# Patient Record
Sex: Female | Born: 1975 | Race: White | Hispanic: No | Marital: Married | Smoking: Never smoker
Health system: Southern US, Community
[De-identification: ages and names within clinical notes are randomized; demographics above are authoritative.]

## PROBLEM LIST (undated history)

## (undated) DIAGNOSIS — G43909 Migraine, unspecified, not intractable, without status migrainosus: Secondary | ICD-10-CM

## (undated) HISTORY — PX: TONSILLECTOMY: SUR1361

## (undated) HISTORY — DX: Migraine, unspecified, not intractable, without status migrainosus: G43.909

## (undated) HISTORY — PX: PLACEMENT OF BREAST IMPLANTS: SHX6334

---

## 2019-07-11 ENCOUNTER — Other Ambulatory Visit: Payer: Self-pay

## 2019-07-12 ENCOUNTER — Encounter: Payer: Self-pay | Admitting: Family Medicine

## 2019-07-12 ENCOUNTER — Other Ambulatory Visit: Payer: Self-pay | Admitting: *Deleted

## 2019-07-12 ENCOUNTER — Telehealth: Payer: Self-pay | Admitting: Family Medicine

## 2019-07-12 ENCOUNTER — Ambulatory Visit (INDEPENDENT_AMBULATORY_CARE_PROVIDER_SITE_OTHER): Payer: Managed Care, Other (non HMO) | Admitting: Family Medicine

## 2019-07-12 VITALS — BP 160/110 | HR 77 | Temp 98.1°F | Ht 65.0 in | Wt 188.6 lb

## 2019-07-12 DIAGNOSIS — I10 Essential (primary) hypertension: Secondary | ICD-10-CM

## 2019-07-12 DIAGNOSIS — Z7689 Persons encountering health services in other specified circumstances: Secondary | ICD-10-CM

## 2019-07-12 DIAGNOSIS — Z2821 Immunization not carried out because of patient refusal: Secondary | ICD-10-CM

## 2019-07-12 DIAGNOSIS — Z1239 Encounter for other screening for malignant neoplasm of breast: Secondary | ICD-10-CM

## 2019-07-12 DIAGNOSIS — Z124 Encounter for screening for malignant neoplasm of cervix: Secondary | ICD-10-CM | POA: Diagnosis not present

## 2019-07-12 DIAGNOSIS — R1011 Right upper quadrant pain: Secondary | ICD-10-CM

## 2019-07-12 MED ORDER — LOSARTAN POTASSIUM 50 MG PO TABS: 50.0000 mg | ORAL_TABLET | Freq: Every day | ORAL | 3 refills | Status: DC

## 2019-07-12 NOTE — Telephone Encounter (Signed)
Medication reordered to new pharmacy for patient- attempted to call patient to inform that request was finished- mailbox is full message.

## 2019-07-12 NOTE — Progress Notes (Signed)
Yesenia Ortiz is a 43 y.o. female  Chief Complaint  Patient presents with  . Establish Care    est care/ went to UC for migraine 7 days ago/ vomiting 3 days/ giving zofran/ RUQ pain 7 days    HPI: Yesenia Ortiz is a 43 y.o. female here to establish care with our office. She moved to area from [place] 1 year ago. She has 2 boys (40yo and 41yo), married.   She has a h/o migraine headaches, usually associated with her menstrual cycle. She typically takes excedrin. She tried Rx meds but had side effects. She experienced the worse headache to date about 1 week ago. She went to UC due to intensity of headache, vomiting. She was given zofran PRN which was effective. She is not taking it anymore. No headache today.  Pts BP is elevated in office today. She notes having elevated BP in the past and she states this comes and goes, "but mostly stays now".  She has been on med for HTN in the past but did not feel "it was the right medication due to side effects" , mostly fatigue. She was on propranolol 20mg  BID. She notes increased stress due to 2 boys with virtual learning. Pt has also gained weight in the past 1+ year. She walks 2 miles at least 4 days per week.  Mom with HTN.   Pt had 1 episode of RUQ pain with nausea and vomiting along with migraine headache 1 week ago. She does not eat fried foods and cannot recall what she had eaten prior to onset of symptoms. She is back to normal appetite and denies n/v/d/c, abd pain, fever, chills. She states she "feels something mild" in RUQ only with certain movements.  Pt is due for CPE, fasting labs. She also needs PAP, mammo and requests GYN referral.   Past Medical History:  Diagnosis Date  . Migraines     Past Surgical History:  Procedure Laterality Date  . PLACEMENT OF BREAST IMPLANTS    . TONSILLECTOMY      Social History   Socioeconomic History  . Marital status: Married    Spouse name: Not on file  . Number of children: Not on file  . Years  of education: Not on file  . Highest education level: Not on file  Occupational History  . Not on file  Social Needs  . Financial resource strain: Not on file  . Food insecurity    Worry: Not on file    Inability: Not on file  . Transportation needs    Medical: Not on file    Non-medical: Not on file  Tobacco Use  . Smoking status: Never Smoker  . Smokeless tobacco: Never Used  Substance and Sexual Activity  . Alcohol use: Never    Frequency: Never  . Drug use: Never  . Sexual activity: Not on file  Lifestyle  . Physical activity    Days per week: Not on file    Minutes per session: Not on file  . Stress: Not on file  Relationships  . Social Herbalist on phone: Not on file    Gets together: Not on file    Attends religious service: Not on file    Active member of club or organization: Not on file    Attends meetings of clubs or organizations: Not on file    Relationship status: Not on file  . Intimate partner violence    Fear of current or  ex partner: Not on file    Emotionally abused: Not on file    Physically abused: Not on file    Forced sexual activity: Not on file  Other Topics Concern  . Not on file  Social History Narrative  . Not on file    History reviewed. No pertinent family history.    There is no immunization history on file for this patient.  Outpatient Encounter Medications as of 07/12/2019  Medication Sig  . losartan (COZAAR) 50 MG tablet Take 1 tablet (50 mg total) by mouth daily.  . ondansetron (ZOFRAN-ODT) 8 MG disintegrating tablet TAKE 1 TABLET BY MOUTH EVERY 6 8 HOURS AS NEEDED   No facility-administered encounter medications on file as of 07/12/2019.      ROS: Pertinent positives and negatives noted in HPI. Remainder of ROS non-contributory    No Known Allergies  BP (!) 160/110   Pulse 77   Temp 98.1 F (36.7 C) (Oral)   Ht 5\' 5"  (1.651 m)   Wt 188 lb 9.6 oz (85.5 kg)   SpO2 98%   BMI 31.38 kg/m   BP Readings from  Last 3 Encounters:  07/12/19 (!) 160/110     Physical Exam  Constitutional: She is oriented to person, place, and time. She appears well-developed and well-nourished. No distress.  Cardiovascular: Normal rate, regular rhythm and normal heart sounds.  Pulmonary/Chest: Effort normal and breath sounds normal. No respiratory distress. She has no wheezes. She has no rhonchi.  Abdominal: Soft. Bowel sounds are normal. She exhibits no distension. There is no abdominal tenderness. There is no rebound and no guarding.  Musculoskeletal:        General: No edema.  Neurological: She is alert and oriented to person, place, and time.  Skin: Skin is warm and dry.  Psychiatric: She has a normal mood and affect. Her behavior is normal.     A/P:  1. Encounter to establish care with new doctor - due for CPE, fasting labs - pt will schedule this for 2-3 wks from now  2. Influenza vaccination declined by patient  3. Essential hypertension - not currently on meds - discussed importance of low sodium diet and regular CV exercise Rx: - losartan (COZAAR) 50 MG tablet; Take 1 tablet (50 mg total) by mouth daily.  Dispense: 90 tablet; Refill: 3 - f/u in 2-3 wks or sooner PRN Discussed plan and reviewed medications with patient, including risks, benefits, and potential side effects. Pt expressed understand. All questions answered.  4. Screening for cervical cancer - Ambulatory referral to Gynecology  5. Screening for breast cancer - MM DIGITAL SCREENING BILATERAL; Future  6. RUQ pain - single episode 1 week ago, now resolved. Could be GB, MSK, other. Will monitor for now and advised pt to f/u if symptoms recur. Could consider labs (hep function panel, lipase) and RUQ [place]

## 2019-07-12 NOTE — Progress Notes (Signed)
Patient requested medication be forwarded to different pharmacy. Reordered for patient.

## 2019-07-12 NOTE — Telephone Encounter (Signed)
Pt stated insurance would not cover her rx at [facility] and would like for it to be sent to local [facility]. [facility] would not transfer.  losartan (COZAAR) 50 MG tablet  [id number] - [place], [place] - [address] [phone] (Phone) [phone] (Fax)

## 2019-07-13 ENCOUNTER — Ambulatory Visit: Payer: Self-pay | Admitting: Nurse Practitioner

## 2019-08-02 ENCOUNTER — Other Ambulatory Visit: Payer: Self-pay

## 2019-08-03 ENCOUNTER — Ambulatory Visit (INDEPENDENT_AMBULATORY_CARE_PROVIDER_SITE_OTHER): Payer: Managed Care, Other (non HMO) | Admitting: Family Medicine

## 2019-08-03 ENCOUNTER — Encounter: Payer: Self-pay | Admitting: Family Medicine

## 2019-08-03 VITALS — BP 124/100 | HR 76 | Temp 98.2°F | Ht 65.0 in | Wt 190.0 lb

## 2019-08-03 DIAGNOSIS — Z2821 Immunization not carried out because of patient refusal: Secondary | ICD-10-CM

## 2019-08-03 DIAGNOSIS — I10 Essential (primary) hypertension: Secondary | ICD-10-CM

## 2019-08-03 DIAGNOSIS — Z0000 Encounter for general adult medical examination without abnormal findings: Secondary | ICD-10-CM

## 2019-08-03 LAB — LIPID PANEL
Cholesterol: 246 mg/dL — ABNORMAL HIGH (ref 0–200)
HDL: 46.5 mg/dL (ref >39.00)
LDL Cholesterol: 177 mg/dL — ABNORMAL HIGH (ref 0–99)
NonHDL: 199
Total CHOL/HDL Ratio: 5
Triglycerides: 109 mg/dL (ref 0.0–149.0)
VLDL: 21.8 mg/dL (ref 0.0–40.0)

## 2019-08-03 LAB — BASIC METABOLIC PANEL
BUN: 11 mg/dL (ref 6–23)
CO2: 25 mEq/L (ref 19–32)
Calcium: 9.5 mg/dL (ref 8.4–10.5)
Chloride: 103 mEq/L (ref 96–112)
Creatinine, Ser: 0.94 mg/dL (ref 0.40–1.20)
GFR: 65.42 mL/min (ref >60.00)
Glucose, Bld: 91 mg/dL (ref 70–99)
Potassium: 4.3 mEq/L (ref 3.5–5.1)
Sodium: 136 mEq/L (ref 135–145)

## 2019-08-03 LAB — VITAMIN D 25 HYDROXY (VIT D DEFICIENCY, FRACTURES): VITD: 39.39 ng/mL (ref 30.00–100.00)

## 2019-08-03 LAB — ALT: ALT: 17 U/L (ref 0–35)

## 2019-08-03 LAB — AST: AST: 22 U/L (ref 0–37)

## 2019-08-03 NOTE — Progress Notes (Signed)
Yesenia Ortiz is a 43 y.o. female  Chief Complaint  Patient presents with  . Annual Exam    CPE- Fasting/denies flu shot/ no pap    HPI: Yesenia Ortiz is a 43 y.o. female here for CPE, fasting labs. She denies flu vaccine. She is also here to f/u on her HTN and starting losartan 50mg  daily at last OV 3 wks ago. No side effects. She has not been checking BP at home.  Last PAP: has appt scheduled Last mammo: has appt scheduled Vision: UTD  Dental: due for cleaning  Exercise: biking 3-4 times per week, was walking previously  Past Medical History:  Diagnosis Date  . Migraines     Past Surgical History:  Procedure Laterality Date  . PLACEMENT OF BREAST IMPLANTS    . TONSILLECTOMY      Social History   Socioeconomic History  . Marital status: Married    Spouse name: Not on file  . Number of children: Not on file  . Years of education: Not on file  . Highest education level: Not on file  Occupational History  . Not on file  Social Needs  . Financial resource strain: Not on file  . Food insecurity    Worry: Not on file    Inability: Not on file  . Transportation needs    Medical: Not on file    Non-medical: Not on file  Tobacco Use  . Smoking status: Never Smoker  . Smokeless tobacco: Never Used  Substance and Sexual Activity  . Alcohol use: Never    Frequency: Never  . Drug use: Never  . Sexual activity: Not on file  Lifestyle  . Physical activity    Days per week: Not on file    Minutes per session: Not on file  . Stress: Not on file  Relationships  . Social on phone: Not on file    Gets together: Not on file    Attends religious service: Not on file    Active member of club or organization: Not on file    Attends meetings of clubs or organizations: Not on file    Relationship status: Not on file  . Intimate partner violence    Fear of current or ex partner: Not on file    Emotionally abused: Not on file    Physically abused: Not  on file    Forced sexual activity: Not on file  Other Topics Concern  . Not on file  Social History Narrative  . Not on file    History reviewed. No pertinent family history.    There is no immunization history on file for this patient.  Outpatient Encounter Medications as of 08/03/2019  Medication Sig  . losartan (COZAAR) 50 MG tablet Take 1 tablet (50 mg total) by mouth daily.  . [DISCONTINUED] ondansetron (ZOFRAN-ODT) 8 MG disintegrating tablet TAKE 1 TABLET BY MOUTH EVERY 6 8 HOURS AS NEEDED   No facility-administered encounter medications on file as of 08/03/2019.      ROS: Gen: no fever, chills  Skin: no rash, itching ENT: no ear pain, ear drainage, nasal congestion, rhinorrhea, sinus pressure, sore throat Eyes: no blurry vision, double vision Resp: no cough, wheeze,SOB Breast: no breast tenderness, no nipple discharge, no breast masses CV: no CP, palpitations, LE edema,  GI: no heartburn, n/v/d/c, abd pain GU: no dysuria, urgency, frequency, hematuria; no vaginal itching, odor, discharge MSK: no joint pain, myalgias, back pain Neuro: no dizziness,  headache, weakness, vertigo Psych: no depression, anxiety, insomnia   No Known Allergies  BP (!) 124/100   Pulse 76   Temp 98.2 F (36.8 C) (Oral)   Ht 5\' 5"  (1.651 m)   Wt 190 lb (86.2 kg)   SpO2 99%   BMI 31.62 kg/m   BP Readings from Last 3 Encounters:  08/03/19 (!) 124/100  07/12/19 (!) 160/110     Physical Exam  Constitutional: She is oriented to person, place, and time. She appears well-developed and well-nourished. No distress.  HENT:  Head: Normocephalic and atraumatic.  Right Ear: Tympanic membrane and ear canal normal.  Left Ear: Tympanic membrane and ear canal normal.  Nose: Nose normal.  Mouth/Throat: Oropharynx is clear and moist and mucous membranes are normal.  Eyes: Pupils are equal, round, and reactive to light. Conjunctivae are normal.  Neck: Neck supple. Thyromegaly (borderline enlarged)  present.  Cardiovascular: Normal rate, regular rhythm, normal heart sounds and intact distal pulses.  No murmur heard. Pulmonary/Chest: Effort normal and breath sounds normal. No respiratory distress. She has no wheezes. She has no rhonchi.  Abdominal: Soft. Bowel sounds are normal. She exhibits no distension and no mass. There is no abdominal tenderness.  Musculoskeletal:        General: No edema.  Lymphadenopathy:    She has no cervical adenopathy.  Neurological: She is alert and oriented to person, place, and time. She exhibits normal muscle tone. Coordination normal.  Skin: Skin is warm and dry.  Psychiatric: She has a normal mood and affect. Her behavior is normal.     A/P:  1. Annual physical exam - mammo and PAP appts scheduled with GYN - vision exam UTD and pt needs to schedule dental appt - cont with regular CV exercise and low fat diet - discussed importance of regular CV exercise, healthy diet, adequate sleep - ALT - AST - Basic metabolic panel - Lipid panel - VITAMIN D 25 Hydroxy (Vit-D Deficiency, Fractures) - next CPE in 1 year  2. Influenza vaccination declined  3. Essential hypertension - SBP at goal, DBP still elevated on losartan 50mg  daily - pt will continue losartan 50mg  daily - asked pt to check BP at home 2-3x/wk x 3 wks and keep log of readings. She will send MyChart message with readings - cont with regular CV exercise and low sodium diet

## 2019-08-03 NOTE — Patient Instructions (Signed)
Health Maintenance, Female Adopting a healthy lifestyle and getting preventive care are important in promoting health and wellness. Ask your health care provider about:  The right schedule for you to have regular tests and exams.  Things you can do on your own to prevent diseases and keep yourself healthy. What should I know about diet, weight, and exercise? Eat a healthy diet   Eat a diet that includes plenty of vegetables, fruits, low-fat dairy products, and lean protein.  Do not eat a lot of foods that are high in solid fats, added sugars, or sodium. Maintain a healthy weight Body mass index (BMI) is used to identify weight problems. It estimates body fat based on height and weight. Your health care provider can help determine your BMI and help you achieve or maintain a healthy weight. Get regular exercise Get regular exercise. This is one of the most important things you can do for your health. Most adults should:  Exercise for at least 150 minutes each week. The exercise should increase your heart rate and make you sweat (moderate-intensity exercise).  Do strengthening exercises at least twice a week. This is in addition to the moderate-intensity exercise.  Spend less time sitting. Even light physical activity can be beneficial. Watch cholesterol and blood lipids Have your blood tested for lipids and cholesterol at 41 years of age, then have this test every 5 years. Have your cholesterol levels checked more often if:  Your lipid or cholesterol levels are high.  You are older than 40 years of age.  You are at high risk for heart disease. What should I know about cancer screening? Depending on your health history and family history, you may need to have cancer screening at various ages. This may include screening for:  Breast cancer.  Cervical cancer.  Colorectal cancer.  Skin cancer.  Lung cancer. What should I know about heart disease, diabetes, and high blood  pressure? Blood pressure and heart disease  High blood pressure causes heart disease and increases the risk of stroke. This is more likely to develop in people who have high blood pressure readings, are of African descent, or are overweight.  Have your blood pressure checked: ? Every 3-5 years if you are 18-39 years of age. ? Every year if you are 40 years old or older. Diabetes Have regular diabetes screenings. This checks your fasting blood sugar level. Have the screening done:  Once every three years after age 40 if you are at a normal weight and have a low risk for diabetes.  More often and at a younger age if you are overweight or have a high risk for diabetes. What should I know about preventing infection? Hepatitis B If you have a higher risk for hepatitis B, you should be screened for this virus. Talk with your health care provider to find out if you are at risk for hepatitis B infection. Hepatitis C Testing is recommended for:  Everyone born from 1945 through 1965.  Anyone with known risk factors for hepatitis C. Sexually transmitted infections (STIs)  Get screened for STIs, including gonorrhea and chlamydia, if: ? You are sexually active and are younger than 41 years of age. ? You are older than 41 years of age and your health care provider tells you that you are at risk for this type of infection. ? Your sexual activity has changed since you were last screened, and you are at increased risk for chlamydia or gonorrhea. Ask your health care provider if   you are at risk.  Ask your health care provider about whether you are at high risk for HIV. Your health care provider may recommend a prescription medicine to help prevent HIV infection. If you choose to take medicine to prevent HIV, you should first get tested for HIV. You should then be tested every 3 months for as long as you are taking the medicine. Pregnancy  If you are about to stop having your period (premenopausal) and  you may become pregnant, seek counseling before you get pregnant.  Take 400 to 800 micrograms (mcg) of folic acid every day if you become pregnant.  Ask for birth control (contraception) if you want to prevent pregnancy. Osteoporosis and menopause Osteoporosis is a disease in which the bones lose minerals and strength with aging. This can result in bone fractures. If you are 65 years old or older, or if you are at risk for osteoporosis and fractures, ask your health care provider if you should:  Be screened for bone loss.  Take a calcium or vitamin D supplement to lower your risk of fractures.  Be given hormone replacement therapy (HRT) to treat symptoms of menopause. Follow these instructions at home: Lifestyle  Do not use any products that contain nicotine or tobacco, such as cigarettes, e-cigarettes, and chewing tobacco. If you need help quitting, ask your health care provider.  Do not use street drugs.  Do not share needles.  Ask your health care provider for help if you need support or information about quitting drugs. Alcohol use  Do not drink alcohol if: ? Your health care provider tells you not to drink. ? You are pregnant, may be pregnant, or are planning to become pregnant.  If you drink alcohol: ? Limit how much you use to 0-1 drink a day. ? Limit intake if you are breastfeeding.  Be aware of how much alcohol is in your drink. In the [place] one drink equals one 12 oz bottle of beer (355 mL), one 5 oz glass of wine (148 mL), or one 1 oz glass of hard liquor (44 mL). General instructions  Schedule regular health, dental, and eye exams.  Stay current with your vaccines.  Tell your health care provider if: ? You often feel depressed. ? You have ever been abused or do not feel safe at home. Summary  Adopting a healthy lifestyle and getting preventive care are important in promoting health and wellness.  Follow your health care provider's instructions about healthy  diet, exercising, and getting tested or screened for diseases.  Follow your health care provider's instructions on monitoring your cholesterol and blood pressure. This information is not intended to replace advice given to you by your health care provider. Make sure you discuss any questions you have with your health care provider. Document Released: 05/11/2011 Document Revised: 10/19/2018 Document Reviewed: 10/19/2018 Elsevier Patient Education  2020 Elsevier Inc[employer]  

## 2019-09-07 ENCOUNTER — Ambulatory Visit
Admission: RE | Admit: 2019-09-07 | Discharge: 2019-09-07 | Disposition: A | Discharge status: Home / Self Care | Payer: Managed Care, Other (non HMO) | Source: Ambulatory Visit | Attending: Family Medicine | Admitting: Family Medicine

## 2019-09-07 ENCOUNTER — Other Ambulatory Visit: Payer: Self-pay

## 2019-09-07 DIAGNOSIS — Z1239 Encounter for other screening for malignant neoplasm of breast: Secondary | ICD-10-CM

## 2019-09-21 ENCOUNTER — Ambulatory Visit (INDEPENDENT_AMBULATORY_CARE_PROVIDER_SITE_OTHER): Payer: Managed Care, Other (non HMO) | Admitting: Obstetrics & Gynecology

## 2019-09-21 ENCOUNTER — Encounter: Payer: Self-pay | Admitting: Obstetrics & Gynecology

## 2019-09-21 ENCOUNTER — Other Ambulatory Visit: Payer: Self-pay

## 2019-09-21 VITALS — BP 134/90 | Ht 64.5 in | Wt 187.0 lb

## 2019-09-21 DIAGNOSIS — Z01419 Encounter for gynecological examination (general) (routine) without abnormal findings: Secondary | ICD-10-CM

## 2019-09-21 DIAGNOSIS — N920 Excessive and frequent menstruation with regular cycle: Secondary | ICD-10-CM

## 2019-09-21 DIAGNOSIS — Z3049 Encounter for surveillance of other contraceptives: Secondary | ICD-10-CM

## 2019-09-21 LAB — CBC
HCT: 41.3 % (ref 35.0–45.0)
Hemoglobin: 13.5 g/dL (ref 11.7–15.5)
MCH: 26.5 pg — ABNORMAL LOW (ref 27.0–33.0)
MCHC: 32.7 g/dL (ref 32.0–36.0)
MCV: 81 fL (ref 80.0–100.0)
MPV: 10.9 fL (ref 7.5–12.5)
Platelets: 294 10*3/uL (ref 140–400)
RBC: 5.1 10*6/uL (ref 3.80–5.10)
RDW: 13.5 % (ref 11.0–15.0)
WBC: 8.2 10*3/uL (ref 3.8–10.8)

## 2019-09-21 LAB — TSH: TSH: 2.93 mIU/L

## 2019-09-21 NOTE — Progress Notes (Signed)
Yesenia Ortiz 03/08/1976 [id number]   History:    43 y.o. G2P2L2 Married.  Moved from [place].  2 sons 98 and 76 yo.  RP:  New patient presenting for annual gyn exam   HPI: Progressively heavier periods over the years.  Heavy with blood clots x 3 days, total of 7 days of bleeding every month.  No BTB.  Using condoms.  No pelvic pain.  No pain with IC.  Urine/BMs normal.  Breasts normal with bilateral implants.  BMI 31.6.  Not exercising regularly.  Health labs with Fam MD.  Past medical history,surgical history, family history and social history were all reviewed and documented in the EPIC chart.  Gynecologic History Patient's last menstrual period was 08/24/2019. Contraception: condoms Last Pap: >4 yrs ago, normal per patient Last mammogram: 08/2019. Results were: Negative Bone Density: Never Colonoscopy: Never  Obstetric History OB History  Gravida Para Term Preterm AB Living  2 2       2   SAB TAB Ectopic Multiple Live Births               # Outcome Date GA Lbr Len/2nd Weight Sex Delivery Anes PTL Lv  2 Para           1 Para              ROS: A ROS was performed and pertinent positives and negatives are included in the history.  GENERAL: No fevers or chills. HEENT: No change in vision, no earache, sore throat or sinus congestion. NECK: No pain or stiffness. CARDIOVASCULAR: No chest pain or pressure. No palpitations. PULMONARY: No shortness of breath, cough or wheeze. GASTROINTESTINAL: No abdominal pain, nausea, vomiting or diarrhea, melena or bright red blood per rectum. GENITOURINARY: No urinary frequency, urgency, hesitancy or dysuria. MUSCULOSKELETAL: No joint or muscle pain, no back pain, no recent trauma. DERMATOLOGIC: No rash, no itching, no lesions. ENDOCRINE: No polyuria, polydipsia, no heat or cold intolerance. No recent change in weight. HEMATOLOGICAL: No anemia or easy bruising or bleeding. NEUROLOGIC: No headache, seizures, numbness, tingling or weakness.  PSYCHIATRIC: No depression, no loss of interest in normal activity or change in sleep pattern.     Exam:   BP 134/90   Ht 5' 4.5" (1.638 m)   Wt 187 lb (84.8 kg)   LMP 08/24/2019   BMI 31.60 kg/m   Body mass index is 31.6 kg/m.  General appearance : Well developed well nourished female. No acute distress HEENT: Eyes: no retinal hemorrhage or exudates,  Neck supple, trachea midline, no carotid bruits, no thyroidmegaly Lungs: Clear to auscultation, no rhonchi or wheezes, or rib retractions  Heart: Regular rate and rhythm, no murmurs or gallops Breast:Examined in sitting and supine position were symmetrical in appearance with bilateral implants, no palpable masses or tenderness,  no skin retraction, no nipple inversion, no nipple discharge, no skin discoloration, no axillary or supraclavicular lymphadenopathy Abdomen: no palpable masses or tenderness, no rebound or guarding Extremities: no edema or skin discoloration or tenderness  Pelvic: Vulva: Normal             Vagina: No gross lesions or discharge  Cervix: No gross lesions or discharge.  Pap reflex done.  Uterus  AV, normal size, shape and consistency, non-tender and mobile  Adnexa  Without masses or tenderness  Anus: Normal   Assessment/Plan:  43 y.o. female for annual exam   1. Encounter for routine gynecological examination with Papanicolaou smear of cervix Normal gynecologic exam.  Pap reflex done.  Breast exam status post bilateral implants normal.  Screening mammogram October 2020 was negative.  Health labs with family physician.  2. Encounter for surveillance of condom contraception Currently using condoms for contraception.  Prefers not to use hormonal contraception.  Interested in LPS BT/S, will discuss further at follow-up visit..  3. Menorrhagia with regular cycle Progressive menorrhagia with regular menses.  Will investigate with a CBC and TSH today and a follow-up pelvic ultrasound to rule out fibroids, polyps  and other endometrial pathology.  Management per results.  Interested in Endometrial Ablation. - US Transvaginal Non-OB; Future - CBC - TSH  Counseling on above issues and coordination of care more than 50% for 10 minutes.  Genia Del MD, 12:31 PM 09/21/2019

## 2019-09-21 NOTE — Patient Instructions (Signed)
1. Encounter for routine gynecological examination with Papanicolaou smear of cervix Normal gynecologic exam.  Pap reflex done.  Breast exam status post bilateral implants normal.  Screening mammogram October 2020 was negative.  Health labs with family physician.  2. Encounter for surveillance of condom contraception Currently using condoms for contraception.  Prefers not to use hormonal contraception.  Interested in LPS BT/S, will discuss further at follow-up visit..  3. Menorrhagia with regular cycle Progressive menorrhagia with regular menses.  Will investigate with a CBC and TSH today and a follow-up pelvic ultrasound to rule out fibroids, polyps and other endometrial pathology.  Management per results.  Interested in Endometrial Ablation. - US Transvaginal Non-OB; Future - CBC - TSH  Kemora, it was a pleasure meeting you today!  I will inform you of your results as soon as they are available.

## 2019-09-22 ENCOUNTER — Encounter: Payer: Self-pay | Admitting: *Deleted

## 2019-09-25 LAB — PAP IG W/ RFLX HPV ASCU

## 2019-10-16 ENCOUNTER — Other Ambulatory Visit: Payer: Self-pay

## 2019-10-17 ENCOUNTER — Ambulatory Visit: Payer: Managed Care, Other (non HMO) | Admitting: Obstetrics & Gynecology

## 2019-10-17 ENCOUNTER — Other Ambulatory Visit: Payer: Managed Care, Other (non HMO)

## 2019-11-27 ENCOUNTER — Other Ambulatory Visit: Payer: Managed Care, Other (non HMO)

## 2019-11-27 ENCOUNTER — Ambulatory Visit: Payer: Managed Care, Other (non HMO) | Admitting: Obstetrics & Gynecology

## 2020-06-19 ENCOUNTER — Other Ambulatory Visit: Payer: Self-pay | Admitting: Family Medicine

## 2020-06-19 DIAGNOSIS — I10 Essential (primary) hypertension: Secondary | ICD-10-CM

## 2020-10-09 ENCOUNTER — Other Ambulatory Visit: Payer: Self-pay | Admitting: Family Medicine

## 2020-10-09 DIAGNOSIS — I10 Essential (primary) hypertension: Secondary | ICD-10-CM

## 2020-10-10 NOTE — Telephone Encounter (Signed)
Last fill 06/20/20  #30/0 Last OV 08/03/19

## 2020-11-20 IMAGING — MG DIGITAL SCREENING IMPLANTS W/ CAD
8 series · 8 of 8 positions shown · non-contrast
Comparison: None.

CLINICAL DATA: Screening.

EXAM:
DIGITAL SCREENING BILATERAL MAMMOGRAM WITH IMPLANTS AND CAD
The patient has retropectoral implants. Standard and implant
displaced views were performed.

[R CC (1 of 2)]
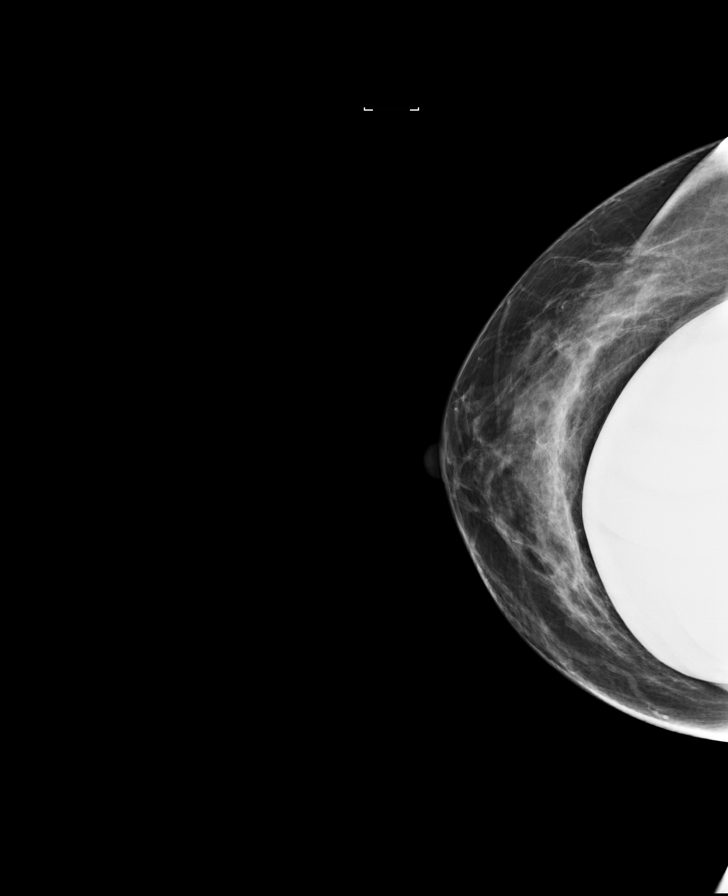

[R MLO (1 of 2)]
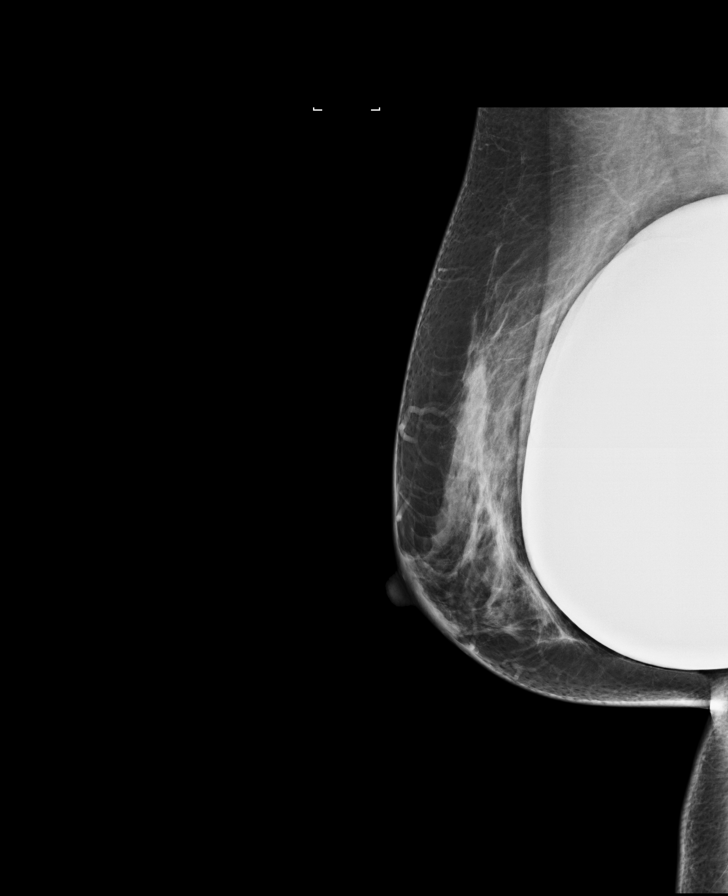

[L CC (1 of 2)]
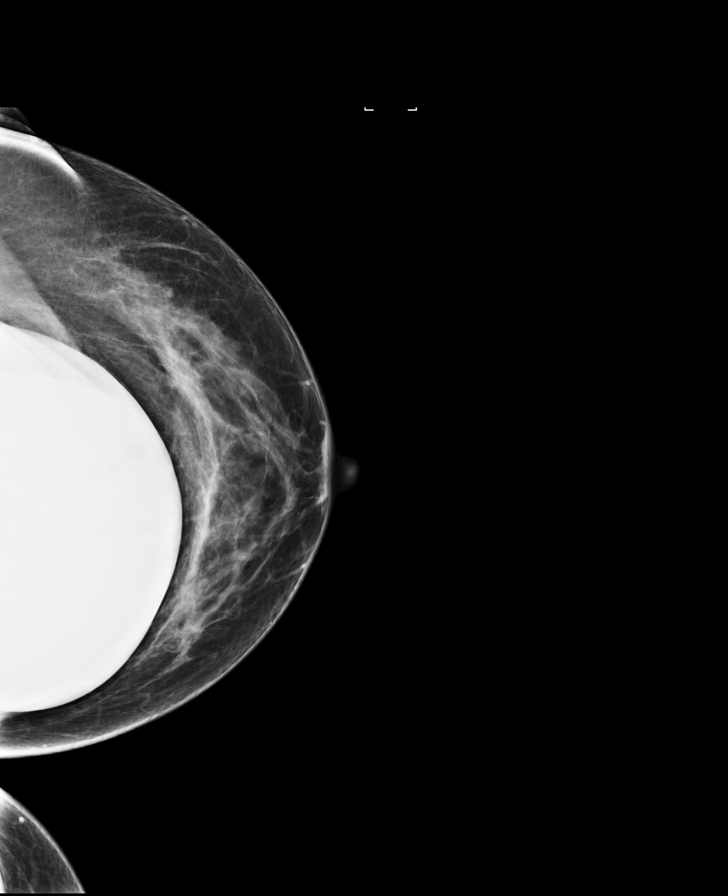

[R MLO (2 of 2)]
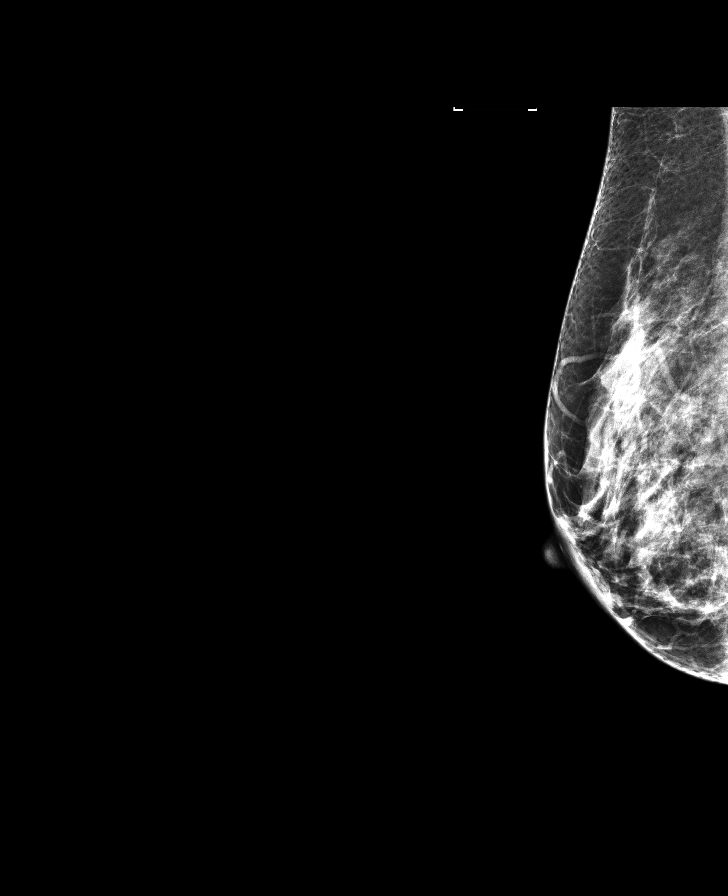

[L MLO (1 of 2)]
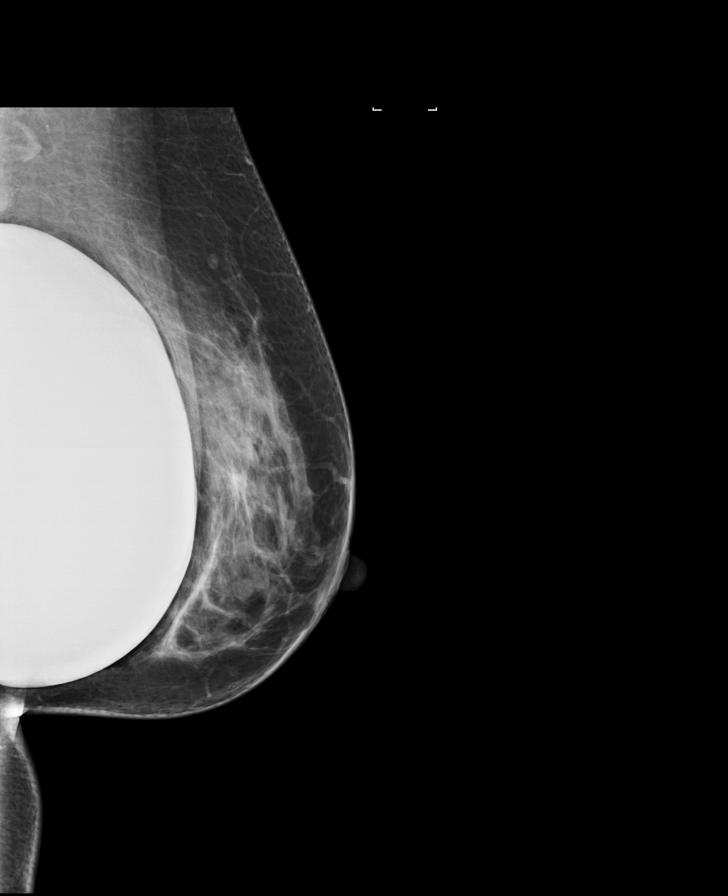

[L MLO (2 of 2)]
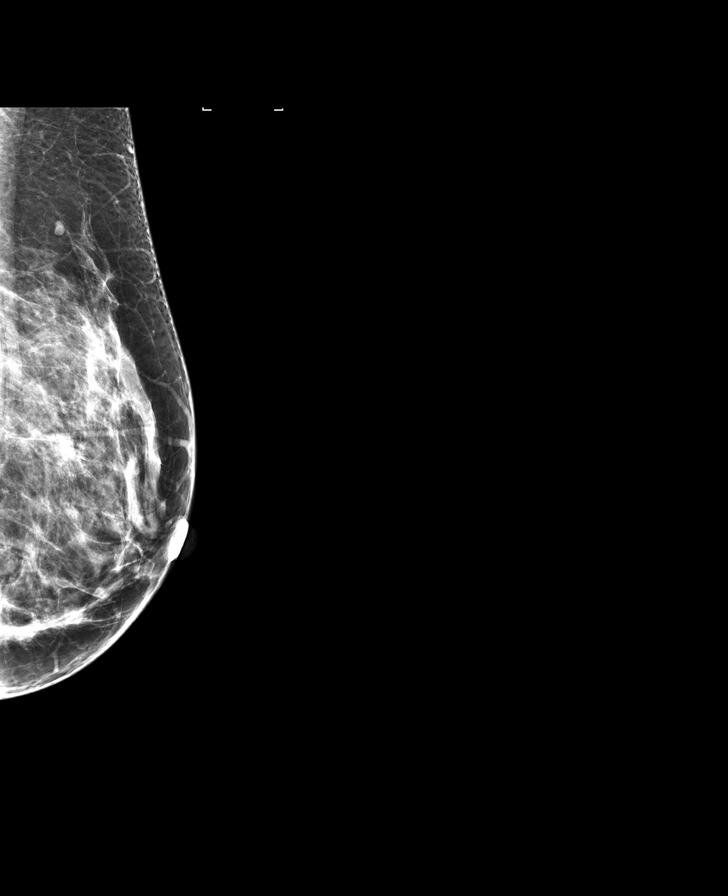

[L CC (2 of 2)]
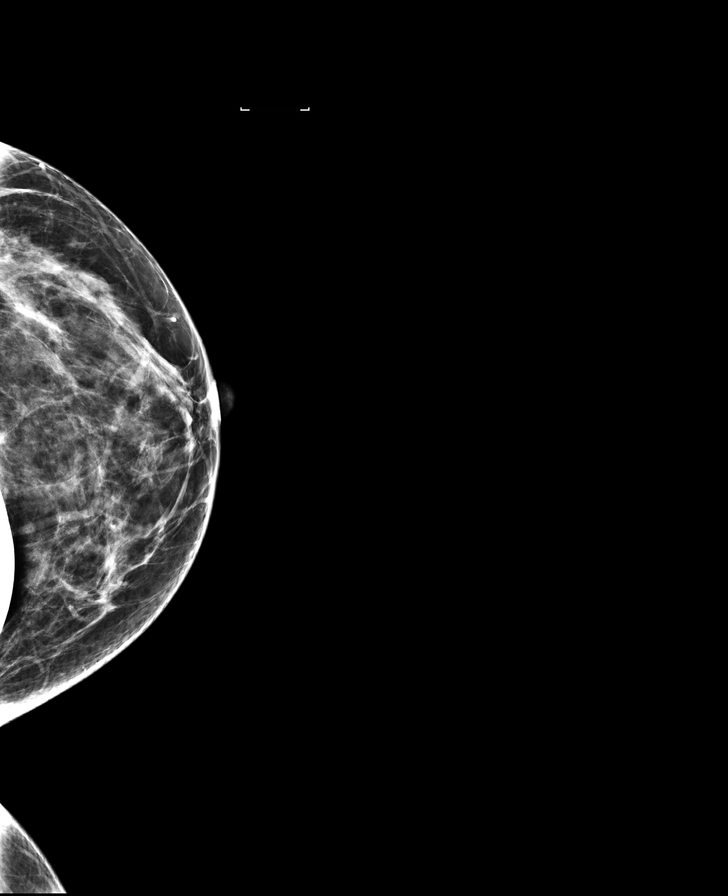

[R CC (2 of 2)]
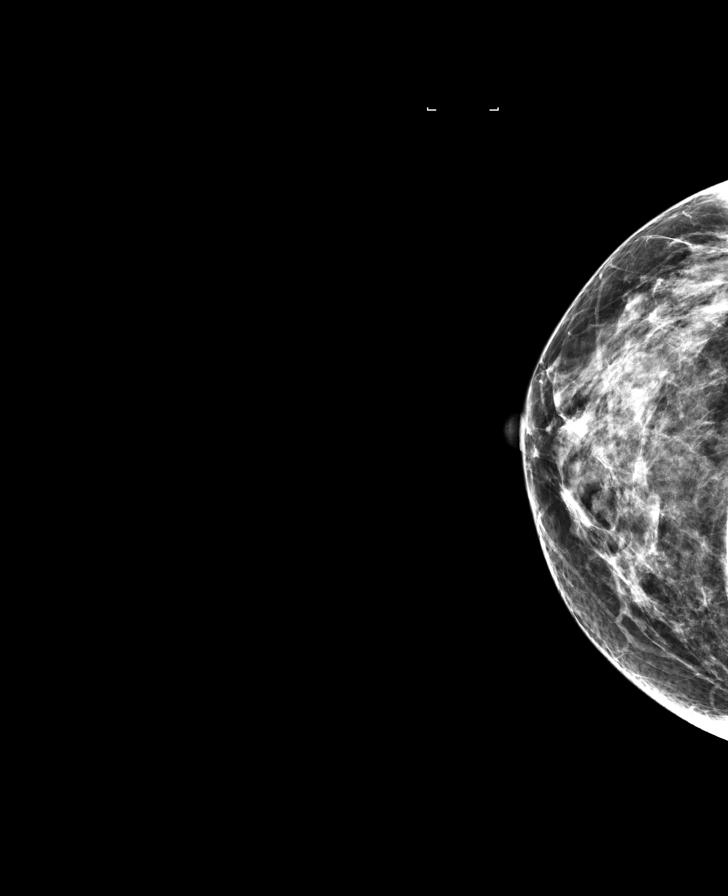

[8 of 8 positions shown; findings below may reference images not displayed]

ACR Breast Density Category c: The breast tissue is heterogeneously
dense, which may obscure small masses.
FINDINGS: There are no findings suspicious for malignancy. Images were
processed with CAD.
IMPRESSION: No mammographic evidence of malignancy. A result letter of this
screening mammogram will be mailed directly to the patient.

RECOMMENDATION:
Screening mammogram in one year. (Code:25-2-LRU)

BI-RADS CATEGORY  1:  Negative.

## 2021-02-03 ENCOUNTER — Telehealth: Payer: Self-pay | Admitting: Family Medicine

## 2021-02-03 NOTE — Telephone Encounter (Signed)
Patient scheduled an appointment via MyChart stating that her chest felt heavy. Then went on to say that her lung felt heavy and yesterday she had difficulty taking a deep breath. Advised patient that we needed to have her further triaged, then I called the nurse triage line, relayed information given to me and transferred the call to the nurse.

## 2021-02-07 ENCOUNTER — Ambulatory Visit (INDEPENDENT_AMBULATORY_CARE_PROVIDER_SITE_OTHER): Payer: Managed Care, Other (non HMO) | Admitting: Family Medicine

## 2021-02-07 ENCOUNTER — Other Ambulatory Visit: Payer: Self-pay

## 2021-02-07 ENCOUNTER — Encounter: Payer: Self-pay | Admitting: Family Medicine

## 2021-02-07 VITALS — BP 140/120 | HR 67 | Temp 97.7°F | Ht 64.5 in | Wt 195.0 lb

## 2021-02-07 DIAGNOSIS — F419 Anxiety disorder, unspecified: Secondary | ICD-10-CM

## 2021-02-07 DIAGNOSIS — R0600 Dyspnea, unspecified: Secondary | ICD-10-CM

## 2021-02-07 DIAGNOSIS — R0789 Other chest pain: Secondary | ICD-10-CM

## 2021-02-07 DIAGNOSIS — I10 Essential (primary) hypertension: Secondary | ICD-10-CM

## 2021-02-07 DIAGNOSIS — R0609 Other forms of dyspnea: Secondary | ICD-10-CM

## 2021-02-07 DIAGNOSIS — Z8616 Personal history of COVID-19: Secondary | ICD-10-CM

## 2021-02-07 MED ORDER — PROPRANOLOL HCL 40 MG PO TABS: 40.0000 mg | ORAL_TABLET | Freq: Every day | ORAL | 1 refills | Status: DC

## 2021-02-07 MED ORDER — FLUTICASONE-SALMETEROL 250-50 MCG/DOSE IN AEPB: 1.0000 | INHALATION_SPRAY | Freq: Two times a day (BID) | RESPIRATORY_TRACT | 1 refills | Status: AC

## 2021-02-07 MED ORDER — LOSARTAN POTASSIUM 50 MG PO TABS: 50.0000 mg | ORAL_TABLET | Freq: Every day | ORAL | 3 refills | Status: AC

## 2021-02-07 NOTE — Progress Notes (Signed)
Yesenia Ortiz is a 45 y.o. female  Chief Complaint  Patient presents with  . Acute Visit    Pt c/o heaviness feeling in her chest since contracting covid in 08/2019.  Sunday was worse then what she usually feels.    HPI: Yesenia Ortiz is a 45 y.o. female who complains of "heaviness" or "thickness" in Rt her chest since covid infection in 08/2019. She feels like she cannot take as deep of a breath. She denies SOB at rest. She states when she walks she is more out of breath than she used to be. She has done this 3x/wk x years. She was not able walk this past week due to feeling SOB. No issues with minimal exertion. No n/v, diaphoresis.   She feels "way more anxious" in the past year.  She is taking propranolol 20mg  qHS for anxiety. She feels this helps but is not enough.  She is not taking losartan for BP. She states med ran out.   Past Medical History:  Diagnosis Date  . Migraines     Past Surgical History:  Procedure Laterality Date  . PLACEMENT OF BREAST IMPLANTS    . TONSILLECTOMY      Social History   Socioeconomic History  . Marital status: Married    Spouse name: Not on file  . Number of children: Not on file  . Years of education: Not on file  . Highest education level: Not on file  Occupational History  . Not on file  Tobacco Use  . Smoking status: Never Smoker  . Smokeless tobacco: Never Used  Vaping Use  . Vaping Use: Never used  Substance and Sexual Activity  . Alcohol use: Never  . Drug use: Never  . Sexual activity: Yes    Partners: Male    Comment: 1st intercourse- 53, married- 14 yrs   Other Topics Concern  . Not on file  Social History Narrative  . Not on file   Social Determinants of Health   Financial Resource Strain: Not on file  Food Insecurity: Not on file  Transportation Needs: Not on file  Physical Activity: Not on file  Stress: Not on file  Social Connections: Not on file  Intimate Partner Violence: Not on file     Family History  Problem Relation Age of Onset  . Hyperlipidemia Maternal Grandfather       There is no immunization history on file for this patient.  Outpatient Encounter Medications as of 02/07/2021  Medication Sig  . Fluticasone-Salmeterol (WIXELA INHUB) 250-50 MCG/DOSE AEPB Inhale 1 puff into the lungs in the morning and at bedtime.  . [DISCONTINUED] propranolol (INDERAL) 20 MG tablet Take 20 mg by mouth 1 day or 1 dose.  . losartan (COZAAR) 50 MG tablet Take 1 tablet (50 mg total) by mouth daily. Need appt for further refills  . propranolol (INDERAL) 40 MG tablet Take 1 tablet (40 mg total) by mouth at bedtime.  . [DISCONTINUED] losartan (COZAAR) 50 MG tablet Take 1 tablet (50 mg total) by mouth daily. Need appt for further refills (Patient not taking: Reported on 02/07/2021)   No facility-administered encounter medications on file as of 02/07/2021.     ROS: Pertinent positives and negatives noted in HPI. Remainder of ROS non-contributory  No Known Allergies  BP (!) 140/120 (BP Location: Left Arm, Patient Position: Sitting, Cuff Size: Normal)   Pulse 67   Temp 97.7 F (36.5 C) (Temporal)   Ht 5' 4.5" (1.638 m)  Wt 195 lb (88.5 kg)   LMP 01/31/2021   SpO2 99%   BMI 32.95 kg/m   Wt Readings from Last 3 Encounters:  02/07/21 195 lb (88.5 kg)  09/21/19 187 lb (84.8 kg)  08/03/19 190 lb (86.2 kg)   Temp Readings from Last 3 Encounters:  02/07/21 97.7 F (36.5 C) (Temporal)  08/03/19 98.2 F (36.8 C) (Oral)  07/12/19 98.1 F (36.7 C) (Oral)   BP Readings from Last 3 Encounters:  02/07/21 (!) 140/120  09/21/19 134/90  08/03/19 (!) 124/100   Pulse Readings from Last 3 Encounters:  02/07/21 67  08/03/19 76  07/12/19 77     Physical Exam Constitutional:      General: She is not in acute distress.    Appearance: She is not ill-appearing.  Cardiovascular:     Rate and Rhythm: Normal rate and regular rhythm.     Pulses: Normal pulses.     Heart sounds:  Normal heart sounds.  Pulmonary:     Effort: Pulmonary effort is normal. No respiratory distress.     Breath sounds: Normal breath sounds. No wheezing or rhonchi.  Chest:     Chest wall: No tenderness.  Neurological:     Mental Status: She is alert and oriented to person, place, and time.  Psychiatric:        Attention and Perception: Attention normal.        Mood and Affect: Mood is anxious.        Behavior: Behavior normal.        Thought Content: Thought content normal.      A/P:  1. Chest pressure - EKG 12-Lead - sinus bradycardia, no acute findings  2. Anxiety - not controlled Increase: - propranolol (INDERAL) 40 MG tablet; Take 1 tablet (40 mg total) by mouth at bedtime.  Dispense: 90 tablet; Refill: 1 - increased from 20mg   - f/u in 4 wks or sooner PRN  3. Essential hypertension - uncontrolled, off med Refill: - losartan (COZAAR) 50 MG tablet; Take 1 tablet (50 mg total) by mouth daily. Need appt for further refills  Dispense: 90 tablet; Refill: 3 - check BP at home and keep log - pt will send mychart message with readings in 3-4wks  4. DOE (dyspnea on exertion) 5. Personal history of COVID-19 Rx: - Fluticasone-Salmeterol (WIXELA INHUB) 250-50 MCG/DOSE AEPB; Inhale 1 puff into the lungs in the morning and at bedtime.  Dispense: 60 each; Refill: 1 - 4 wk trial and if no/minimal improvement, will proceed with pulm referral and possible imaging  This visit occurred during the SARS-CoV-2 public health emergency.  Safety protocols were in place, including screening questions prior to the visit, additional usage of staff PPE, and extensive cleaning of exam room while observing appropriate contact time as indicated for disinfecting solutions.

## 2021-07-07 ENCOUNTER — Other Ambulatory Visit: Payer: Self-pay

## 2021-07-07 DIAGNOSIS — F419 Anxiety disorder, unspecified: Secondary | ICD-10-CM

## 2021-07-07 MED ORDER — PROPRANOLOL HCL 40 MG PO TABS: 40.0000 mg | ORAL_TABLET | Freq: Every day | ORAL | 0 refills | Status: AC

## 2021-07-07 NOTE — Telephone Encounter (Signed)
Refill request for: Propanolol HCL  40 mg  LR 02/07/21, #90, 1 rf LOV 02/07/21 FOV  none scheduled.  Please review and advise.  Thanks.  Dm/cma

## 2022-08-19 ENCOUNTER — Other Ambulatory Visit (HOSPITAL_BASED_OUTPATIENT_CLINIC_OR_DEPARTMENT_OTHER): Payer: Self-pay | Admitting: Family Medicine

## 2022-08-19 DIAGNOSIS — R1032 Left lower quadrant pain: Secondary | ICD-10-CM

## 2022-08-24 ENCOUNTER — Ambulatory Visit (HOSPITAL_BASED_OUTPATIENT_CLINIC_OR_DEPARTMENT_OTHER)
Admission: RE | Admit: 2022-08-24 | Discharge: 2022-08-24 | Disposition: A | Discharge status: Home / Self Care | Payer: Managed Care, Other (non HMO) | Source: Ambulatory Visit | Attending: Family Medicine | Admitting: Family Medicine

## 2022-08-24 DIAGNOSIS — R1032 Left lower quadrant pain: Secondary | ICD-10-CM | POA: Insufficient documentation

## 2023-06-14 ENCOUNTER — Other Ambulatory Visit (HOSPITAL_COMMUNITY): Payer: Self-pay

## 2024-01-11 ENCOUNTER — Other Ambulatory Visit (HOSPITAL_BASED_OUTPATIENT_CLINIC_OR_DEPARTMENT_OTHER): Payer: Self-pay

## 2024-01-11 ENCOUNTER — Ambulatory Visit (HOSPITAL_BASED_OUTPATIENT_CLINIC_OR_DEPARTMENT_OTHER): Admitting: Student

## 2024-01-11 ENCOUNTER — Ambulatory Visit (HOSPITAL_BASED_OUTPATIENT_CLINIC_OR_DEPARTMENT_OTHER)

## 2024-01-11 DIAGNOSIS — M542 Cervicalgia: Secondary | ICD-10-CM

## 2024-01-11 MED ORDER — METHOCARBAMOL 500 MG PO TABS
500.0000 mg | ORAL_TABLET | Freq: Four times a day (QID) | ORAL | 0 refills | Status: AC | PRN
  Filled 2024-01-11: qty 40, 10d supply, fill #0

## 2024-01-11 NOTE — Progress Notes (Signed)
 Chief Complaint: Neck pain     History of Present Illness:   Discussed the use of AI scribe software for clinical note transcription with the patient, who gave verbal consent to proceed.  Yesenia Ortiz is a 48 year old female who presents with chronic neck pain.  She has been experiencing neck pain for over a year, with progressive worsening over the past three months. The pain is localized to the neck and does not radiate down the arms. It is described as stiff that has become more severe, affecting her ability to sleep. The pain is particularly noticeable when turning her head, especially to the left, and is accompanied by a 'crank' sensation. No numbness or tingling in the neck, shoulder, or arm is reported.  She experiences frequent headaches, which she attributes to the neck pain. These headaches are often triggered by neck tension and are occasionally managed with Excedrin, although it does not completely alleviate the neck pain. She has tried Tylenol and Tylenol PM without significant relief. Excedrin helps with her headaches but not the neck pain. Massages and an Theme park manager provide temporary relief but do not resolve the underlying pain. 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     Surgical History:   None  PMH/PSH/Family History/Social History/Meds/Allergies:    Past Medical History:  Diagnosis Date   Migraines    Past Surgical History:  Procedure Laterality Date   PLACEMENT OF BREAST IMPLANTS     TONSILLECTOMY     Social History   Socioeconomic History   Marital status: Married    Spouse name: Not on file   Number of children: Not on file   Years of education: Not on file   Highest education level: Not on file  Occupational History   Not on file  Tobacco Use   Smoking status: Never   Smokeless tobacco: Never  Vaping Use   Vaping status: Never  Used  Substance and Sexual Activity   Alcohol use: Never   Drug use: Never   Sexual activity: Yes    Partners: Male    Comment: 1st intercourse- 67, married- 14 yrs   Other Topics Concern   Not on file  Social History Narrative   Not on file   Social Drivers of Health   Financial Resource Strain: Not on file  Food Insecurity: Not on file  Transportation Needs: Not on file  Physical Activity: Not on file  Stress: Not on file  Social Connections: Not on file   Family History  Problem Relation Age of Onset   Hyperlipidemia Maternal Grandfather    No Known Allergies Current Outpatient Medications  Medication Sig Dispense Refill   Fluticasone-Salmeterol (WIXELA INHUB) 250-50 MCG/DOSE AEPB Inhale 1 puff into the lungs in the morning and at bedtime. 60 each 1   losartan (COZAAR) 50 MG tablet Take 1 tablet (50 mg total) by mouth daily. Need appt for further refills 90 tablet 3   propranolol (INDERAL) 40 MG tablet Take 1 tablet (40 mg total) by mouth at bedtime. 90 tablet 0   No current facility-administered medications for this visit.   No results found.  Review of Systems:   A ROS was performed including pertinent positives and negatives as documented in the HPI.  Physical  Exam :   Constitutional: NAD and appears stated age Neurological: Alert and oriented Psych: Appropriate affect and cooperative There were no vitals taken for this visit.   Comprehensive Musculoskeletal Exam:    Tenderness to palpation along the left cervical paraspinal muscles without midline tenderness.  Slight forward head posture.  Decreased active cervical rotation and sidebending mainly to the left.  Negative Spurling's.  No significant tenderness in the upper back musculature or scapular area.  Imaging:   Xray (cervical spine 4 views): Mild disc degeneration at the C5-C6 and C6-C7 levels with small anterior osteophytes.  Loss of lordotic curvature noted within the mid to lower cervical spine.   Negative for acute abnormality.   I personally reviewed and interpreted the radiographs.   Assessment and Plan:    Neck Pain   Chronic neck pain has worsened over the past three months without radiation or neurological symptoms. X-ray reveals mild degenerative changes and loss of lordotic curvature, likely from muscular tension. Pain disrupts sleep and daily activities.  This is most likely muscular in nature which may be a triggering cause of her headaches.  Refer to physical therapy for improvements of muscle tension and range of motion in the neck as well as potential dry needling. Prescribe Robaxin as needed for muscle relaxation, cautioning about potential drowsiness.     I personally saw and evaluated the patient, and participated in the management and treatment plan.  Hazle Nordmann, PA-C Orthopedics

## 2024-02-03 ENCOUNTER — Other Ambulatory Visit (HOSPITAL_BASED_OUTPATIENT_CLINIC_OR_DEPARTMENT_OTHER): Payer: Self-pay

## 2024-02-03 ENCOUNTER — Ambulatory Visit (HOSPITAL_BASED_OUTPATIENT_CLINIC_OR_DEPARTMENT_OTHER): Admitting: Student

## 2024-02-03 ENCOUNTER — Encounter (HOSPITAL_BASED_OUTPATIENT_CLINIC_OR_DEPARTMENT_OTHER): Payer: Self-pay

## 2024-02-03 ENCOUNTER — Encounter (HOSPITAL_BASED_OUTPATIENT_CLINIC_OR_DEPARTMENT_OTHER): Payer: Self-pay | Admitting: Student

## 2024-02-03 DIAGNOSIS — M542 Cervicalgia: Secondary | ICD-10-CM

## 2024-02-03 MED ORDER — METHYLPREDNISOLONE 4 MG PO TBPK
ORAL_TABLET | ORAL | 0 refills | Status: AC
  Filled 2024-02-03: qty 21, 6d supply, fill #0

## 2024-02-03 NOTE — Progress Notes (Signed)
 Chief Complaint: Neck pain     History of Present Illness:   Patient presents today for follow-up of chronic neck pain.  States that she has not yet begun working with physical therapy.  Patient does report some increased stiffness within her neck and upper back and has developed some numbness and tingling radiating down the left arm as well as the left leg which have both since resolved.  Pain and stiffness has been preventing her from being able to sleep at night.  Was getting good relief from Robaxin, however this has not been alleviating her symptoms over the past few days.  Surgical History:   None  PMH/PSH/Family History/Social History/Meds/Allergies:    Past Medical History:  Diagnosis Date   Migraines    Past Surgical History:  Procedure Laterality Date   PLACEMENT OF BREAST IMPLANTS     TONSILLECTOMY     Social History   Socioeconomic History   Marital status: Married    Spouse name: Not on file   Number of children: Not on file   Years of education: Not on file   Highest education level: Not on file  Occupational History   Not on file  Tobacco Use   Smoking status: Never   Smokeless tobacco: Never  Vaping Use   Vaping status: Never Used  Substance and Sexual Activity   Alcohol use: Never   Drug use: Never   Sexual activity: Yes    Partners: Male    Comment: 1st intercourse- 45, married- 14 yrs   Other Topics Concern   Not on file  Social History Narrative   Not on file   Social Drivers of Health   Financial Resource Strain: Not on file  Food Insecurity: Not on file  Transportation Needs: Not on file  Physical Activity: Not on file  Stress: Not on file  Social Connections: Not on file   Family History  Problem Relation Age of Onset   Hyperlipidemia Maternal Grandfather    No Known Allergies Current Outpatient Medications  Medication Sig Dispense Refill   methylPREDNISolone (MEDROL DOSEPAK) 4 MG TBPK tablet  Take per packet instructions 21 each 0   Fluticasone-Salmeterol (WIXELA INHUB) 250-50 MCG/DOSE AEPB Inhale 1 puff into the lungs in the morning and at bedtime. 60 each 1   losartan (COZAAR) 50 MG tablet Take 1 tablet (50 mg total) by mouth daily. Need appt for further refills 90 tablet 3   propranolol (INDERAL) 40 MG tablet Take 1 tablet (40 mg total) by mouth at bedtime. 90 tablet 0   No current facility-administered medications for this visit.   No results found.  Review of Systems:   A ROS was performed including pertinent positives and negatives as documented in the HPI.  Physical Exam :   Constitutional: NAD and appears stated age Neurological: Alert and oriented Psych: Appropriate affect and cooperative There were no vitals taken for this visit.   Comprehensive Musculoskeletal Exam:    Mild tenderness with palpation in the left cervical paraspinal muscles as well as bilateral upper trapezius.  Full cervical range of motion with flexion, extension, and bilateral rotation.  Negative Spurling's.  Full active shoulder range of motion bilaterally.  Grip strength 5/5 and equal.  Imaging:     Assessment and Plan:    Neck Pain   Patient  has experienced increase in neck pain and stiffness over the past few days as well as development of some radiculopathy down the left arm which has since resolved.  She continues to have significant muscular tightness throughout the neck and upper back which I believe is a large contributor to her symptoms.  I will plan to start her on a Medrol Dosepak today in order to hopefully provide some symptom relief and will continue plan to begin with physical therapy.  Will likely plan to follow-up in another 4 to 5 weeks for reassessment.     I personally saw and evaluated the patient, and participated in the management and treatment plan.  Hazle Nordmann, PA-C Orthopedics

## 2024-02-09 ENCOUNTER — Other Ambulatory Visit: Payer: Self-pay

## 2024-02-09 ENCOUNTER — Ambulatory Visit: Attending: Student

## 2024-02-09 DIAGNOSIS — M542 Cervicalgia: Secondary | ICD-10-CM | POA: Diagnosis present

## 2024-02-09 DIAGNOSIS — M6281 Muscle weakness (generalized): Secondary | ICD-10-CM | POA: Diagnosis present

## 2024-02-09 DIAGNOSIS — R293 Abnormal posture: Secondary | ICD-10-CM | POA: Insufficient documentation

## 2024-02-09 DIAGNOSIS — R252 Cramp and spasm: Secondary | ICD-10-CM | POA: Diagnosis present

## 2024-02-09 NOTE — Therapy (Signed)
 OUTPATIENT PHYSICAL THERAPY CERVICAL EVALUATION   Patient Name: Yesenia Ortiz MRN: [id number] DOB:06/11/1976, 48 y.o., female Today's Date: 02/09/2024  END OF SESSION:  PT End of Session - 02/09/24 1022     Visit Number 1    Date for PT Re-Evaluation 04/05/24    Authorization Type [employer]    PT Start Time 1020    PT Stop Time 1100    PT Time Calculation (min) 40 min    Activity Tolerance Patient tolerated treatment well    Behavior During Therapy WFL for tasks assessed/performed             Past Medical History:  Diagnosis Date   Migraines    Past Surgical History:  Procedure Laterality Date   PLACEMENT OF BREAST IMPLANTS     TONSILLECTOMY     There are no active problems to display for this patient.   PCP: Koren Shiver, DO  REFERRING PROVIDER: Amador Cunas, PA-C  REFERRING DIAG: M54.2 (ICD-10-CM) - Cervicalgia  THERAPY DIAG:  Cervicalgia - Plan: PT plan of care cert/re-cert  Muscle weakness (generalized) - Plan: PT plan of care cert/re-cert  Cramp and spasm - Plan: PT plan of care cert/re-cert  Abnormal posture - Plan: PT plan of care cert/re-cert  Rationale for Evaluation and Treatment: Rehabilitation  ONSET DATE: 02/03/2024  SUBJECTIVE:                                                                                                                                                                                                         SUBJECTIVE STATEMENT: Patient reports pain started in July/Aug. We had been on vacation and I just assumed I had slept in new bed, traveling on the plane, etc.  But the pain never went away.  I haven't had any numbness, tingling, burning except for one night when I slept on it wrong.  Online business, on a computer a lot.   Hand dominance: Right  PERTINENT HISTORY:  na  PAIN:  02/09/24 Are you having pain? Yes: NPRS scale: 6/10 Pain location: left side of neck and sub occipital area, left upper  trap Pain description: aching, annoying Aggravating factors: static positions, driving Relieving factors: rest, meds, heat  PRECAUTIONS: None  RED FLAGS: None     WEIGHT BEARING RESTRICTIONS: No  FALLS:  Has patient fallen in last 6 months? 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  DIAGNOSTIC FINDINGS:  Mild degenerative changes  PATIENT SURVEYS:  NDI 16/50= 32%  COGNITION: Overall cognitive status: Within functional limits for tasks assessed  SENSATION: WFL  POSTURE: rounded shoulders and forward head  PALPATION: Significant tightness and fascial restriction bilateral upper traps and parascapular areas.   CERVICAL ROM:   WFL  UPPER EXTREMITY ROM:  WFL  UPPER EXTREMITY MMT: Generally 4+ to 5/5  CERVICAL SPECIAL TESTS:  Spurling's test: Negative   TREATMENT DATE: 02/09/24 Initial eval completed and initiated HEP                                                                                                                                 PATIENT EDUCATION:  Education details: Initiated HEP and initial eval completed, educated on correct posture and anatomy of the c spine, upper trap and levator involvement if the rotator cuff is compromised Person educated: Patient Education method: Programmer, multimedia, Facilities manager, Verbal cues, and Handouts Education comprehension: verbalized understanding, returned demonstration, and  verbal cues required  HOME EXERCISE PROGRAM: Access Code: [email or web address] URL: [email or web address] Date: 02/09/2024 Prepared by: Mikey Kirschner  Exercises - Seated Cervical Retraction  - 1 x daily - 7 x weekly - 3 sets - 10 reps - Seated Cervical Rotation AROM  - 1 x daily - 7 x weekly - 3 sets - 10 reps - Seated Cervical Sidebending AROM  - 1 x daily - 7 x weekly - 3 sets - 10 reps - Shoulder extension with resistance - Neutral  - 1 x daily - 7 x weekly - 3 sets - 10 reps - Standing Shoulder Row with Anchored Resistance  - 1 x daily - 7 x weekly - 3 sets - 10 reps - Seated Shoulder External Rotation AAROM with Pulley  - 1 x daily - 7 x weekly - 3 sets - 10 reps - Seated Shoulder Horizontal Abduction with Resistance  - 1 x daily - 7 x weekly - 3 sets - 10 reps  ASSESSMENT:  CLINICAL IMPRESSION: Patient is a 48 y.o. female who was seen today for physical therapy evaluation and treatment for neck pain.  She presents with mild decreased C spine ROM and elevated pain.  She also presents with trigger points and fascial restriction in bilateral upper traps and parascapular areas.  She would respond well to C spine ROM, postural strengthening, rotator cuff strengthening, dry needling and modalities for pain relief.    OBJECTIVE IMPAIRMENTS: decreased ROM, decreased strength, increased fascial restrictions, increased muscle spasms, impaired flexibility, impaired UE functional use, postural dysfunction, and pain.   ACTIVITY LIMITATIONS: carrying, lifting, transfers, bed mobility, bathing, dressing, reach over head, hygiene/grooming,  and caring for others  PARTICIPATION LIMITATIONS: meal prep, cleaning, laundry, driving, shopping, community activity, occupation, and yard work  PERSONAL FACTORS: Fitness are also affecting patient's functional outcome.   REHAB POTENTIAL: Good  CLINICAL DECISION MAKING: Stable/uncomplicated  EVALUATION COMPLEXITY: Low   GOALS: Goals reviewed with  patient? Yes  SHORT TERM GOALS: Target date: 03/08/2024  Pain report to be no greater than 4/10  Baseline:  Goal status: INITIAL  2.  Patient will be independent with initial HEP  Baseline:  Goal status: INITIAL   LONG TERM GOALS: Target date: 04/05/2024  Patient to report pain no greater than 2/10  Baseline:  Goal status: INITIAL  2.  Patient to be independent with advanced HEP  Baseline:  Goal status: INITIAL  3.  Full pain free C spine ROM Baseline:  Goal status: INITIAL  4.  Patient to be able to sleep through the night  Baseline:  Goal status: INITIAL  5.  NDI to be below 20% Baseline:  Goal status: INITIAL    PLAN:  PT FREQUENCY: 1-2x/week  PT DURATION: 8 weeks  PLANNED INTERVENTIONS: [id number]-Therapeutic exercises, 97530- Therapeutic activity, [id number]- Neuromuscular re-education, 97535- Self Care, [id number]- Manual therapy, [phone]- Canalith repositioning, [id number]- Electrical stimulation (unattended), [id number]- Electrical stimulation (manual), [id number]- Ultrasound, [id number]- Traction (mechanical), [id number]- Ionotophoresis 4mg /ml Dexamethasone, Patient/Family education, Taping, Dry Needling, Joint mobilization, Spinal mobilization, Vestibular training, Visual/preceptual remediation/compensation, Cryotherapy, and Moist heat  PLAN FOR NEXT SESSION: UBE, review HEP, progress postural strengthening, add in scapular stabilization and rotator cuff strengthening.    Victorino Dike B. Mishaal Lansdale, PT 02/09/24 4:35 PM [facility] [address] [place], [place] [address] Phone # [phone] Fax [phone]

## 2024-02-11 ENCOUNTER — Other Ambulatory Visit (HOSPITAL_BASED_OUTPATIENT_CLINIC_OR_DEPARTMENT_OTHER): Payer: Self-pay | Admitting: Family Medicine

## 2024-02-11 DIAGNOSIS — Z8249 Family history of ischemic heart disease and other diseases of the circulatory system: Secondary | ICD-10-CM

## 2024-02-15 ENCOUNTER — Ambulatory Visit: Admitting: Physical Therapy

## 2024-02-17 ENCOUNTER — Encounter: Admitting: Physical Therapy

## 2024-02-23 ENCOUNTER — Encounter

## 2024-02-28 ENCOUNTER — Encounter: Admitting: Physical Therapy

## 2024-03-03 ENCOUNTER — Encounter: Admitting: Physical Therapy

## 2024-03-07 ENCOUNTER — Encounter: Admitting: Physical Therapy

## 2024-03-10 ENCOUNTER — Encounter: Admitting: Physical Therapy

## 2024-03-14 ENCOUNTER — Encounter: Admitting: Physical Therapy

## 2024-03-17 ENCOUNTER — Encounter: Admitting: Physical Therapy

## 2024-03-21 ENCOUNTER — Encounter: Admitting: Physical Therapy

## 2024-03-22 DIAGNOSIS — W57XXXA Bitten or stung by nonvenomous insect and other nonvenomous arthropods, initial encounter: Secondary | ICD-10-CM | POA: Diagnosis not present

## 2024-03-22 DIAGNOSIS — L0889 Other specified local infections of the skin and subcutaneous tissue: Secondary | ICD-10-CM | POA: Diagnosis not present

## 2024-03-24 ENCOUNTER — Encounter: Admitting: Physical Therapy

## 2024-03-28 ENCOUNTER — Encounter: Admitting: Physical Therapy

## 2024-03-31 ENCOUNTER — Encounter: Admitting: Physical Therapy

## 2024-04-04 ENCOUNTER — Encounter: Admitting: Physical Therapy

## 2024-04-07 ENCOUNTER — Encounter: Admitting: Physical Therapy

## 2024-04-11 ENCOUNTER — Encounter: Admitting: Physical Therapy

## 2024-04-14 ENCOUNTER — Encounter: Admitting: Physical Therapy

## 2024-08-30 DIAGNOSIS — N939 Abnormal uterine and vaginal bleeding, unspecified: Secondary | ICD-10-CM | POA: Diagnosis not present

## 2024-08-30 DIAGNOSIS — Z1331 Encounter for screening for depression: Secondary | ICD-10-CM | POA: Diagnosis not present

## 2024-08-30 DIAGNOSIS — Z01419 Encounter for gynecological examination (general) (routine) without abnormal findings: Secondary | ICD-10-CM | POA: Diagnosis not present

## 2024-08-30 DIAGNOSIS — N920 Excessive and frequent menstruation with regular cycle: Secondary | ICD-10-CM | POA: Diagnosis not present

## 2024-08-30 DIAGNOSIS — Z1231 Encounter for screening mammogram for malignant neoplasm of breast: Secondary | ICD-10-CM | POA: Diagnosis not present

## 2024-08-30 DIAGNOSIS — Z124 Encounter for screening for malignant neoplasm of cervix: Secondary | ICD-10-CM | POA: Diagnosis not present

## 2024-09-11 ENCOUNTER — Encounter: Payer: Self-pay | Admitting: Radiology
